# Patient Record
Sex: Female | Born: 2000 | Race: White | Hispanic: No | Marital: Single | Smoking: Never smoker
Health system: Southern US, Community
[De-identification: ages and names within clinical notes are randomized; demographics above are authoritative.]

---
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
## 2021-07-11 ENCOUNTER — Ambulatory Visit (INDEPENDENT_AMBULATORY_CARE_PROVIDER_SITE_OTHER): Payer: Medicaid Other | Admitting: Family Medicine

## 2021-07-11 ENCOUNTER — Other Ambulatory Visit: Payer: Self-pay

## 2021-07-11 ENCOUNTER — Encounter: Payer: Self-pay | Admitting: Family Medicine

## 2021-07-11 VITALS — BP 137/81 | HR 67 | Temp 98.2°F | Ht 70.0 in | Wt 178.0 lb

## 2021-07-11 DIAGNOSIS — Z30011 Encounter for initial prescription of contraceptive pills: Secondary | ICD-10-CM | POA: Diagnosis not present

## 2021-07-11 DIAGNOSIS — Z23 Encounter for immunization: Secondary | ICD-10-CM

## 2021-07-11 MED ORDER — NORETHIN ACE-ETH ESTRAD-FE 1-20 MG-MCG(24) PO TABS: 1.0000 | ORAL_TABLET | Freq: Every day | ORAL | 4 refills | Status: DC

## 2021-07-11 MED ORDER — NORETHINDRONE ACET-ETHINYL EST 1-20 MG-MCG PO TABS: 1.0000 | ORAL_TABLET | Freq: Every day | ORAL | 4 refills | Status: DC

## 2021-07-11 NOTE — Progress Notes (Signed)
New Patient Office Visit  Subjective:  Patient ID: Andrea Acosta, female    DOB: 16-May-2001  Age: 20 y.o. MRN: [id number]  CC:  Chief Complaint  Patient presents with   Establish Care   Discuss [facility]    Patient would like to discuss starting on BCP   Annual Exam    HPI Andrea Acosta presents to establish care.  She would like to start birth control she has never been on it before but she is sexually active she is currently using condoms.  She does have regular periods but they do tend to be heavy.  She was previously followed by pediatrician overall for [place].  She reports that her vaccinations are up-to-date before she started college last year.  She currently goes to [employer]  Also looked over her vaccination record.  It looks like she never received a second meningeal B  History reviewed. No pertinent past medical history.  History reviewed. No pertinent surgical history.  Family History  Problem Relation Age of Onset   Hypertension Father     Social History   Socioeconomic History   Marital status: Single    Spouse name: Not on file   Number of children: Not on file   Years of education: Not on file   Highest education level: Not on file  Occupational History   Occupation: [employer]    Comment: Art History   Occupation: Consulting civil engineer    Comment: [employer]  Tobacco Use   Smoking status: Never   Smokeless tobacco: Never  Substance and Sexual Activity   Alcohol use: Never   Drug use: Never   Sexual activity: Yes    Birth control/protection: Condom  Other Topics Concern   Not on file  Social History Narrative   Consulting civil engineer at [employer]   Social Determinants of Health   Financial Resource Strain: Not on file  Food Insecurity: Not on file  Transportation Needs: Not on file  Physical Activity: Not on file  Stress: Not on file  Social Connections: Not on file  Intimate Partner Violence: Not on file    ROS Review of Systems  Objective:   Today's Vitals: BP  137/81    Pulse 67    Temp 98.2 F (36.8 C)    Ht 5\' 10"  (1.778 m)    Wt 178 lb (80.7 kg)    LMP 07/05/2021    HC 16" (40.6 cm)    SpO2 98%    BMI 25.54 kg/m   Physical Exam Vitals and nursing note reviewed.  Constitutional:      Appearance: She is well-developed.  HENT:     Head: Normocephalic and atraumatic.  Cardiovascular:     Rate and Rhythm: Normal rate and regular rhythm.     Heart sounds: Normal heart sounds.  Pulmonary:     Effort: Pulmonary effort is normal.     Breath sounds: Normal breath sounds.  Skin:    General: Skin is warm and dry.  Neurological:     Mental Status: She is alert and oriented to person, place, and time.  Psychiatric:        Behavior: Behavior normal.    Assessment & Plan:   Problem List Items Addressed This Visit   None Visit Diagnoses     Initiation of OCP (BCP)    -  Primary   Need for meningococcal vaccination       Relevant Orders   Meningococcal B, OMV (Bexsero) (Completed)  Discussed  options currently available for birth control she is most interested in starting with a pill.  Urine for GC and chlamydia collected today.  Offered to also do syphilis and HIV testing but she says she actually had that done recently.  We will start with birth control pill.  If she has any problems or concerns please let us know.  Offered Meng B 2nd vaccine.   Outpatient Encounter Medications as of 07/11/2021  Medication Sig   Norethindrone Acetate-Ethinyl Estrad-FE (LOESTRIN 24 FE) 1-20 MG-MCG(24) tablet Take 1 tablet by mouth daily.   [DISCONTINUED] ISOtretinoin (ACCUTANE) 40 MG capsule Take by mouth.   [DISCONTINUED] norethindrone-ethinyl estradiol (LOESTRIN 1/20, 21,) 1-20 MG-MCG tablet Take 1 tablet by mouth daily.   [DISCONTINUED] Olopatadine HCl 0.2 % SOLN Place one drop into both eyes daily.   No facility-administered encounter medications on file as of 07/11/2021.    Follow-up: Return in about 1 year (around 07/11/2022) for Wellness  Exam.   Nani Gasser, MD

## 2021-07-11 NOTE — Addendum Note (Signed)
Addended by: Osborne Oman on: 07/11/2021 04:01 PM   Modules accepted: Orders

## 2021-07-12 LAB — C. TRACHOMATIS/N. GONORRHOEAE RNA
C. trachomatis RNA, TMA: NOT DETECTED
N. gonorrhoeae RNA, TMA: NOT DETECTED

## 2021-07-12 NOTE — Progress Notes (Signed)
Hi Donzella,  You are negative for gonorrhea and chlamydia.

## 2021-08-12 ENCOUNTER — Encounter: Payer: Self-pay | Admitting: Family Medicine

## 2021-08-17 MED ORDER — DESOGESTREL-ETHINYL ESTRADIOL 0.15-0.02/0.01 MG (21/5) PO TABS: 1.0000 | ORAL_TABLET | Freq: Every day | ORAL | 4 refills | Status: DC

## 2021-08-17 NOTE — Telephone Encounter (Signed)
Changed birth controls to a slightly different progesterone formulation.  Meds ordered this encounter  Medications   desogestrel-ethinyl estradiol (KARIVA) 0.15-0.02/0.01 MG (21/5) tablet    Sig: Take 1 tablet by mouth daily.    Dispense:  84 tablet    Refill:  4

## 2021-09-26 ENCOUNTER — Ambulatory Visit (INDEPENDENT_AMBULATORY_CARE_PROVIDER_SITE_OTHER): Payer: Medicaid Other | Admitting: Family Medicine

## 2021-09-26 ENCOUNTER — Encounter: Payer: Self-pay | Admitting: Family Medicine

## 2021-09-26 ENCOUNTER — Other Ambulatory Visit: Payer: Self-pay

## 2021-09-26 VITALS — BP 118/71 | HR 86 | Ht 70.0 in | Wt 178.0 lb

## 2021-09-26 DIAGNOSIS — R300 Dysuria: Secondary | ICD-10-CM | POA: Diagnosis not present

## 2021-09-26 LAB — POCT URINALYSIS DIP (CLINITEK)
Bilirubin, UA: NEGATIVE
Blood, UA: NEGATIVE
Glucose, UA: NEGATIVE mg/dL
Ketones, POC UA: NEGATIVE mg/dL
Leukocytes, UA: NEGATIVE
Nitrite, UA: NEGATIVE
POC PROTEIN,UA: NEGATIVE
Spec Grav, UA: 1.015 (ref 1.010–1.025)
Urobilinogen, UA: 0.2 E.U./dL
pH, UA: 6 (ref 5.0–8.0)

## 2021-09-26 MED ORDER — DESOGESTREL-ETHINYL ESTRADIOL 0.15-30 MG-MCG PO TABS: 1.0000 | ORAL_TABLET | Freq: Every day | ORAL | 11 refills | Status: DC

## 2021-09-26 NOTE — Patient Instructions (Signed)
Okay to finish out current pill pack and then switch to the new one.  Give it a try for at least 2 to 3 months and let me know if you are still having problems. ?

## 2021-09-26 NOTE — Progress Notes (Signed)
Pt reports that she was seen at UC last week for this and was given ABX (Bactrim DS 800-160) x3 days. She finished on Saturday.  ?

## 2021-09-26 NOTE — Progress Notes (Signed)
? ?Acute Office Visit ? ?Subjective:  ? ? Patient ID: Andrea Acosta, female    DOB: 2001-05-24, 21 y.o.   MRN: [id number] ? ?Chief Complaint  ?Patient presents with  ? Urinary Tract Infection  ? ? ?HPI ?Patient is in today for urinary sxs. Went to UC last Tuesday and was tx with Bactrim  and feels some better but still having some pain with urination.   ? ?Started new birth control in January and says that she is still having pretty heavy periods to the point of actually passing some blood clots.  She also noticed a little bit of spotting when she started her antibiotic. ? ?She is also been noting that she has been having some pain with intercourse.  Same partner. Sometimes a sharp or aching pain. Can feel achy after intercourse.  Had STI testing in December. ? ?History reviewed. No pertinent past medical history. ? ?History reviewed. No pertinent surgical history. ? ?Family History  ?Problem Relation Age of Onset  ? Hypertension Father   ? ? ?Social History  ? ?Socioeconomic History  ? Marital status: Single  ?  Spouse name: Not on file  ? Number of children: Not on file  ? Years of education: Not on file  ? Highest education level: Not on file  ?Occupational History  ? Occupation: [employer]  ?  Comment: Art History  ? Occupation: Ship broker  ?  Comment: [place]  ?Tobacco Use  ? Smoking status: Never  ? Smokeless tobacco: Never  ?Substance and Sexual Activity  ? Alcohol use: Never  ? Drug use: Never  ? Sexual activity: Yes  ?  Birth control/protection: Condom  ?Other Topics Concern  ? Not on file  ?Social History Narrative  ? Student at [employer]  ? ?Social Determinants of Health  ? ?Financial Resource Strain: Not on file  ?Food Insecurity: Not on file  ?Transportation Needs: Not on file  ?Physical Activity: Not on file  ?Stress: Not on file  ?Social Connections: Not on file  ?Intimate Partner Violence: Not on file  ? ? ?Outpatient Medications Prior to Visit  ?Medication Sig Dispense Refill  ? desogestrel-ethinyl  estradiol (KARIVA) 0.15-0.02/0.01 MG (21/5) tablet Take 1 tablet by mouth daily. 84 tablet 4  ? ?No facility-administered medications prior to visit.  ? ? ?No Known Allergies ? ?Review of Systems ? ?   ?Objective:  ?  ?Physical Exam ?Vitals reviewed.  ?Constitutional:   ?   Appearance: She is well-developed.  ?HENT:  ?   Head: Normocephalic and atraumatic.  ?Eyes:  ?   Conjunctiva/sclera: Conjunctivae normal.  ?Cardiovascular:  ?   Rate and Rhythm: Normal rate.  ?Pulmonary:  ?   Effort: Pulmonary effort is normal.  ?Skin: ?   General: Skin is dry.  ?   Coloration: Skin is not pale.  ?Neurological:  ?   Mental Status: She is alert and oriented to person, place, and time.  ?Psychiatric:     ?   Behavior: Behavior normal.  ? ? ?BP 118/71   Pulse 86   Ht _0  (1.778 m)   Wt 178 lb (80.7 kg)   LMP 09/13/2021 (Exact Date)   SpO2 98%   BMI 25.54 kg/m?  ?Wt Readings from Last 3 Encounters:  ?09/26/21 178 lb (80.7 kg) (94 %, Z= 1.55)*  ?07/11/21 178 lb (80.7 kg) (94 %, Z= 1.56)*  ? ?* Growth percentiles are based on CDC (Girls, 2-20 Years) data.  ? ? ?Health Maintenance Due  ?  Topic Date Due  ? HIV Screening  Never done  ? Hepatitis C Screening  Never done  ? ? ?There are no preventive care reminders to display for this patient. ? ? ?No results found for: TSH ?No results found for: WBC, HGB, HCT, MCV, PLT ?No results found for: NA, K, CHLORIDE, CO2, GLUCOSE, BUN, CREATININE, BILITOT, ALKPHOS, AST, ALT, PROT, ALBUMIN, CALCIUM, ANIONGAP, EGFR, GFR ?No results found for: CHOL ?No results found for: HDL ?No results found for: [place] ?No results found for: TRIG ?No results found for: CHOLHDL ?No results found for: HGBA1C ? ?   ?Assessment & Plan:  ? ?Problem List Items Addressed This Visit   ?None ?Visit Diagnoses   ? ? Dysuria    -  Primary  ? Relevant Orders  ? POCT URINALYSIS DIP (CLINITEK) (Completed)  ? Urine Culture  ? ?  ? ?Dysuria-we will send urinalysis for culture we will call her once the culture results are  back in case we need to switch antibiotics. ? ?Contraceptive counseling-we will switch birth control she has done well in the [place] in regards to not making her feel anxious like the [place] did.  So was switched to Apri. ? ?Pain with intercourse-we discussed a couple of options including increasing lubrication, working on very gradual and gentle entry into the vaginal canal to avoid tightening or spasming.  Because she also has heavy periods also consider other causes such as endometriosis etc.  Discussed  consider pelvic evaluation and pelvic ultrasound in the future she will let me know. ? ?Meds ordered this encounter  ?Medications  ? desogestrel-ethinyl estradiol (APRI) 0.15-30 MG-MCG tablet  ?  Sig: Take 1 tablet by mouth daily.  ?  Dispense:  28 tablet  ?  Refill:  11  ? ? ? ?Beatrice Lecher, MD ? ?

## 2021-09-28 ENCOUNTER — Other Ambulatory Visit: Payer: Self-pay

## 2021-09-28 ENCOUNTER — Other Ambulatory Visit: Payer: Self-pay | Admitting: *Deleted

## 2021-09-28 DIAGNOSIS — R102 Pelvic and perineal pain unspecified side: Secondary | ICD-10-CM

## 2021-09-28 LAB — URINE CULTURE
MICRO NUMBER:: 13096258
Result:: NO GROWTH
SPECIMEN QUALITY:: ADEQUATE

## 2021-09-28 NOTE — Progress Notes (Signed)
Hi Andrea Acosta, negative for urinary tract infection.  I do think the antibiotic cleared everything up.  Hopefully you are feeling better.  As far as her pelvic pain concerns just let me know if you would like me to schedule you for an exam and ultrasound or may be consultation with GYN at any point just let me know.

## 2021-09-30 ENCOUNTER — Ambulatory Visit (INDEPENDENT_AMBULATORY_CARE_PROVIDER_SITE_OTHER): Payer: Medicaid Other

## 2021-09-30 ENCOUNTER — Other Ambulatory Visit: Payer: Self-pay

## 2021-09-30 DIAGNOSIS — R102 Pelvic and perineal pain: Secondary | ICD-10-CM | POA: Diagnosis not present

## 2021-09-30 DIAGNOSIS — N941 Unspecified dyspareunia: Secondary | ICD-10-CM

## 2021-09-30 DIAGNOSIS — N920 Excessive and frequent menstruation with regular cycle: Secondary | ICD-10-CM

## 2021-10-03 NOTE — Progress Notes (Signed)
Hi Andrea Acosta, the thickness of the lining is around 10 mm which is normal.  No other worrisome findings at this point in time.  Current recommendation is to try to find a birth control this can help control your cycle with decreased heaviness and bleeding as well as duration of bleeding.  Please switch in birth controls will be helpful.  But if you continue to have discomfort with intercourse I am more than happy to refer you to a GYN for further work-up.

## 2021-11-28 ENCOUNTER — Encounter: Payer: Self-pay | Admitting: Obstetrics & Gynecology

## 2021-11-28 ENCOUNTER — Ambulatory Visit: Payer: Medicaid Other | Admitting: Obstetrics & Gynecology

## 2021-11-28 ENCOUNTER — Other Ambulatory Visit (HOSPITAL_COMMUNITY)
Admission: RE | Admit: 2021-11-28 | Discharge: 2021-11-28 | Disposition: A | Discharge status: Home / Self Care | Payer: Medicaid Other | Source: Ambulatory Visit | Attending: Obstetrics & Gynecology | Admitting: Obstetrics & Gynecology

## 2021-11-28 VITALS — BP 126/85 | HR 80 | Resp 16 | Ht 71.0 in | Wt 185.0 lb

## 2021-11-28 DIAGNOSIS — N94819 Vulvodynia, unspecified: Secondary | ICD-10-CM | POA: Diagnosis not present

## 2021-11-28 DIAGNOSIS — R102 Pelvic and perineal pain unspecified side: Secondary | ICD-10-CM

## 2021-11-28 DIAGNOSIS — Z113 Encounter for screening for infections with a predominantly sexual mode of transmission: Secondary | ICD-10-CM | POA: Insufficient documentation

## 2021-11-28 DIAGNOSIS — N9489 Other specified conditions associated with female genital organs and menstrual cycle: Secondary | ICD-10-CM

## 2021-11-28 MED ORDER — LIDOCAINE 5 % EX OINT: 1.0000 "application " | TOPICAL_OINTMENT | CUTANEOUS | 0 refills | Status: DC | PRN

## 2021-11-28 NOTE — Progress Notes (Signed)
Vaginal pain with and without pain.  No pain with urination. ?

## 2021-11-28 NOTE — Progress Notes (Signed)
? ?  Subjective:  ? ? Patient ID: Andrea Acosta, female    DOB: 2001-06-01, 21 y.o.   MRN: [id number] ? ?HPI ? ?21 year old female presents for vulvar burning and pain with intercourse.  Intercourse has always been painful on insertion.  Used to not be as painful as it is today.  Now lately has vulvar burning when she wears tight jeans or other pressure on the vulva.  Patient denies any discharge.  Patient has only been sexually active 1 year.  She denies any history of sexually transmitted diseases. ? ?Review of Systems  ?Constitutional: Negative.   ?Respiratory: Negative.    ?Cardiovascular: Negative.   ?Gastrointestinal: Negative.   ?Genitourinary:  Positive for dyspareunia and vaginal pain.  ? ?   ?Objective:  ? Physical Exam ?Vitals reviewed.  ?Constitutional:   ?   General: She is not in acute distress. ?   Appearance: She is well-developed.  ?HENT:  ?   Head: Normocephalic and atraumatic.  ?Eyes:  ?   Conjunctiva/sclera: Conjunctivae normal.  ?Cardiovascular:  ?   Rate and Rhythm: Normal rate.  ?Pulmonary:  ?   Effort: Pulmonary effort is normal.  ?Abdominal:  ?   Palpations: Abdomen is soft.  ?   Tenderness: There is no abdominal tenderness.  ?Genitourinary: ? ? ?Skin: ?   General: Skin is warm and dry.  ?Neurological:  ?   Mental Status: She is alert and oriented to person, place, and time.  ?Psychiatric:     ?   Mood and Affect: Mood normal.  ? ? ?Vitals:  ? 11/28/21 0905  ?BP: 126/85  ?Pulse: 80  ?Resp: 16  ?Weight: 185 lb (83.9 kg)  ?Height: 5\' 11"  (1.803 m)  ? ? ?   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 ? Aptima; will complete TOC ?If no infection will refer to pelvic PT; if +infection will clear infection and see if symptoms resolve.  ?Vulvar hygiene reveiwed. ?Silicone based lubricant for intercourse. ?Lidocaine ointment prn  ?If not better will add on oral medications. ? ?30 minutes was spent during this encounter which included review of records, physical  exam, history taking, counseling, and documentation. ?

## 2021-11-29 LAB — CERVICOVAGINAL ANCILLARY ONLY
Bacterial Vaginitis (gardnerella): NEGATIVE
Candida Glabrata: NEGATIVE
Candida Vaginitis: NEGATIVE
Chlamydia: NEGATIVE
Comment: NEGATIVE
Comment: NEGATIVE
Comment: NEGATIVE
Comment: NEGATIVE
Comment: NEGATIVE
Comment: NORMAL
Neisseria Gonorrhea: NEGATIVE
Trichomonas: NEGATIVE

## 2022-01-09 ENCOUNTER — Ambulatory Visit (INDEPENDENT_AMBULATORY_CARE_PROVIDER_SITE_OTHER): Payer: Medicaid Other | Admitting: Obstetrics & Gynecology

## 2022-01-09 ENCOUNTER — Encounter: Payer: Self-pay | Admitting: Obstetrics & Gynecology

## 2022-01-09 VITALS — BP 123/77 | HR 79 | Ht 71.0 in | Wt 184.0 lb

## 2022-01-09 DIAGNOSIS — N94819 Vulvodynia, unspecified: Secondary | ICD-10-CM | POA: Diagnosis not present

## 2022-01-09 DIAGNOSIS — R102 Pelvic and perineal pain: Secondary | ICD-10-CM | POA: Diagnosis not present

## 2022-01-09 DIAGNOSIS — N941 Unspecified dyspareunia: Secondary | ICD-10-CM

## 2022-01-09 NOTE — Progress Notes (Signed)
   Subjective:    Patient ID: Andrea Acosta, female    DOB: June 23, 2001, 21 y.o.   MRN: [id number]  HPI  Andrea Acosta is a 21 year old G0, P64 female who presents for follow-up of vulvodynia and painful intercourse.  The lidocaine has helped with the pain during intercourse.  She was hesitant to go to the physical therapist and wanted talk more about that today.  No new complaints or issues.  Review of Systems  Constitutional: Negative.   Respiratory: Negative.    Cardiovascular: Negative.   Gastrointestinal: Negative.   Genitourinary:  Positive for dyspareunia.      Objective:   Physical Exam Vitals reviewed.  Constitutional:      General: She is not in acute distress.    Appearance: She is well-developed.  HENT:     Head: Normocephalic and atraumatic.  Eyes:     Conjunctiva/sclera: Conjunctivae normal.  Cardiovascular:     Rate and Rhythm: Normal rate.  Pulmonary:     Effort: Pulmonary effort is normal.  Skin:    General: Skin is warm and dry.  Neurological:     Mental Status: She is alert and oriented to person, place, and time.  Psychiatric:        Mood and Affect: Mood normal.    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  She does not like taking medications and had problems taking birth control pills.  After explaining physical therapy further she agrees to go.  There is an office in [place] that she may try.  I am familiar with the practitioners in [place] with [facility] which is always a great option. Return to clinic in 3 months for checkup.  20 minutes was spent with patient, review of records, counseling, and documentation.

## 2022-07-19 ENCOUNTER — Other Ambulatory Visit: Payer: Self-pay | Admitting: Family Medicine

## 2022-07-25 ENCOUNTER — Other Ambulatory Visit: Payer: Self-pay | Admitting: Family Medicine

## 2022-07-27 ENCOUNTER — Other Ambulatory Visit: Payer: Self-pay | Admitting: Family Medicine

## 2022-10-02 ENCOUNTER — Other Ambulatory Visit: Payer: Self-pay | Admitting: Family Medicine

## 2022-10-04 ENCOUNTER — Telehealth: Payer: Self-pay | Admitting: *Deleted

## 2022-10-04 MED ORDER — DESOGESTREL-ETHINYL ESTRADIOL 0.15-30 MG-MCG PO TABS: 1.0000 | ORAL_TABLET | Freq: Every day | ORAL | 0 refills | Status: DC

## 2022-10-04 NOTE — Telephone Encounter (Signed)
Please call pt she is due for OV to get refills for her birth control.

## 2022-10-24 ENCOUNTER — Other Ambulatory Visit: Payer: Self-pay | Admitting: Family Medicine

## 2022-10-30 ENCOUNTER — Telehealth: Payer: Self-pay | Admitting: Family Medicine

## 2022-10-30 MED ORDER — DESOGESTREL-ETHINYL ESTRADIOL 0.15-30 MG-MCG PO TABS: 1.0000 | ORAL_TABLET | Freq: Every day | ORAL | 0 refills | Status: DC

## 2022-10-30 NOTE — Telephone Encounter (Signed)
Patient is requesting refills on Desogestrel-Ethinyl Estradiol 0.15mg -MCG be sent to [facility] on [address] she is scheduled for May 14th 2024

## 2022-10-30 NOTE — Telephone Encounter (Signed)
Medication sent.

## 2022-10-30 NOTE — Telephone Encounter (Deleted)
Patient is requesting refills on desogestrel-estradiol 0.15-30 MG-MCG tablets be sent to

## 2022-10-30 NOTE — Telephone Encounter (Deleted)
Patient is requesting desogestrel-e

## 2022-11-01 ENCOUNTER — Ambulatory Visit: Payer: Medicaid Other | Admitting: Family Medicine

## 2022-12-05 ENCOUNTER — Ambulatory Visit: Payer: Medicaid Other | Admitting: Family Medicine

## 2022-12-05 ENCOUNTER — Encounter: Payer: Self-pay | Admitting: Family Medicine

## 2022-12-05 VITALS — BP 130/64 | HR 84 | Ht 70.0 in | Wt 188.0 lb

## 2022-12-05 DIAGNOSIS — N94819 Vulvodynia, unspecified: Secondary | ICD-10-CM | POA: Diagnosis not present

## 2022-12-05 DIAGNOSIS — Z113 Encounter for screening for infections with a predominantly sexual mode of transmission: Secondary | ICD-10-CM

## 2022-12-05 DIAGNOSIS — Z23 Encounter for immunization: Secondary | ICD-10-CM

## 2022-12-05 DIAGNOSIS — S0300XA Dislocation of jaw, unspecified side, initial encounter: Secondary | ICD-10-CM | POA: Diagnosis not present

## 2022-12-05 DIAGNOSIS — Z3009 Encounter for other general counseling and advice on contraception: Secondary | ICD-10-CM

## 2022-12-05 MED ORDER — DESOGESTREL-ETHINYL ESTRADIOL 0.15-30 MG-MCG PO TABS: 1.0000 | ORAL_TABLET | Freq: Every day | ORAL | 3 refills | Status: DC

## 2022-12-05 NOTE — Progress Notes (Signed)
   Established Patient Office Visit  Subjective   Patient ID: Sophieann Vanornum, female    DOB: 2001/05/13  Age: 22 y.o. MRN: [id number]  Chief Complaint  Patient presents with   Follow-up    HPI  Here today to follow-up on her birth control.  She is very happy with her current regimen and would like refills.  Due for urine GC and chlamydia yearly.  We did discuss that now that she is 22 and sexually active she does need a Pap smear she is welcome to schedule with me or with Dr. Penne Lash this summer.  Discussed need for Tdap as well she is okay to get that today.  She did want to let me know that she was started on sertraline in the fall 50 mg and feels like it has been helpful and working well.  She also has TMJ and did see an urgent care provider.  She was given a muscle relaxer but then would like she can take with the sertraline.  So she is just been using heat and ice.     ROS    Objective:     BP 130/64   Pulse 84   Ht 5\' 10"  (1.778 m)   Wt 188 lb (85.3 kg)   SpO2 99%   BMI 26.98 kg/m    Physical Exam Vitals and nursing note reviewed.  Constitutional:      Appearance: She is well-developed.  HENT:     Head: Normocephalic and atraumatic.  Cardiovascular:     Rate and Rhythm: Normal rate and regular rhythm.     Heart sounds: Normal heart sounds.  Pulmonary:     Effort: Pulmonary effort is normal.     Breath sounds: Normal breath sounds.  Skin:    General: Skin is warm and dry.  Neurological:     Mental Status: She is alert and oriented to person, place, and time.  Psychiatric:        Behavior: Behavior normal.      No results found for any visits on 12/05/22.    The ASCVD Risk score (Arnett DK, et al., 2019) failed to calculate for the following reasons:   The 2019 ASCVD risk score is only valid for ages 53 to 54    Assessment & Plan:   Problem List Items Addressed This Visit       Musculoskeletal and Integument   TMJ (dislocation of  temporomandibular joint)    Okay to continue with heat and ice.  Recommend relaxation techniques.        Genitourinary   Vulvodynia     Other   Encounter for general counseling and advice on contraceptive management - Primary    Med refilled.       Relevant Medications   desogestrel-ethinyl estradiol (APRI) 0.15-30 MG-MCG tablet   Other Relevant Orders   HIV antibody (with reflex)   Hepatitis C Antibody   RPR   Other Visit Diagnoses     Routine screening for STI (sexually transmitted infection)       Relevant Orders   C. trachomatis/N. gonorrhoeae RNA   HIV antibody (with reflex)   Hepatitis C Antibody   RPR      Tdap given today.  No follow-ups on file.    Nani Gasser, MD

## 2022-12-05 NOTE — Assessment & Plan Note (Signed)
Okay to continue with heat and ice.  Recommend relaxation techniques.

## 2022-12-05 NOTE — Assessment & Plan Note (Signed)
Med refilled.

## 2022-12-06 LAB — C. TRACHOMATIS/N. GONORRHOEAE RNA
C. trachomatis RNA, TMA: NOT DETECTED
N. gonorrhoeae RNA, TMA: NOT DETECTED

## 2022-12-06 LAB — RPR: RPR Ser Ql: NONREACTIVE

## 2022-12-06 LAB — HIV ANTIBODY (ROUTINE TESTING W REFLEX): HIV 1&2 Ab, 4th Generation: NONREACTIVE

## 2022-12-06 LAB — HEPATITIS C ANTIBODY: Hepatitis C Ab: NONREACTIVE

## 2022-12-07 NOTE — Progress Notes (Signed)
Hi Andrea Acosta, STI testing is negative. Great news!

## 2022-12-20 IMAGING — US US PELVIS COMPLETE
1 series · 14 of 25 positions shown · non-contrast
Comparison: None.

CLINICAL DATA: Pelvic pain, heavy bleeding, and dyspareunia.

EXAM:
TRANSABDOMINAL ULTRASOUND OF PELVIS
TECHNIQUE: Transabdominal ultrasound examination of the pelvis was performed
including evaluation of the uterus, ovaries, adnexal regions, and
pelvic cul-de-sac.

[Series 1: us pelvis (transabdominal only) · 14 of 39 slices shown]
[im 1/39]
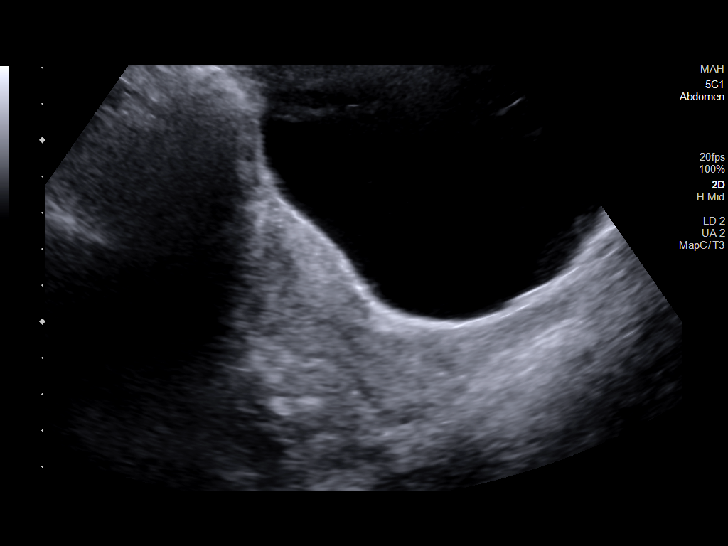
[im 4/39]
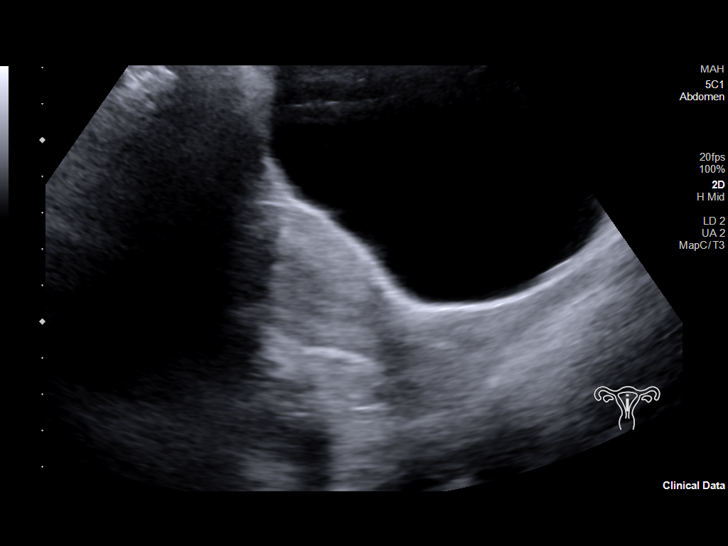
[im 7/39]
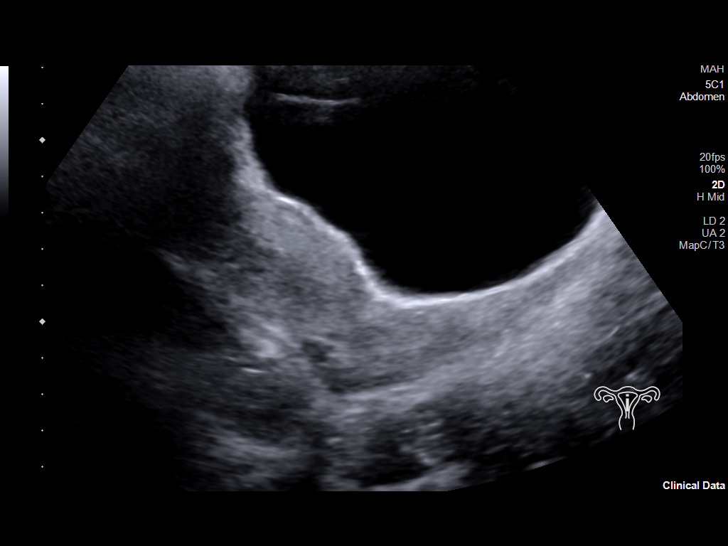
[im 10/39]
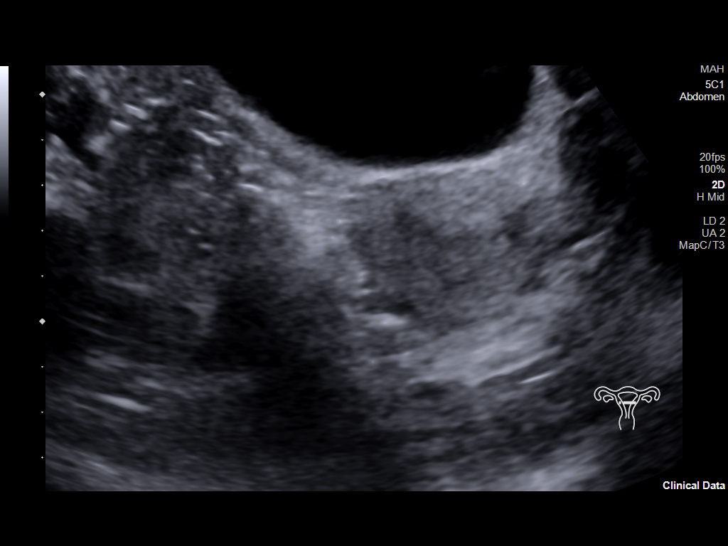
[im 13/39]
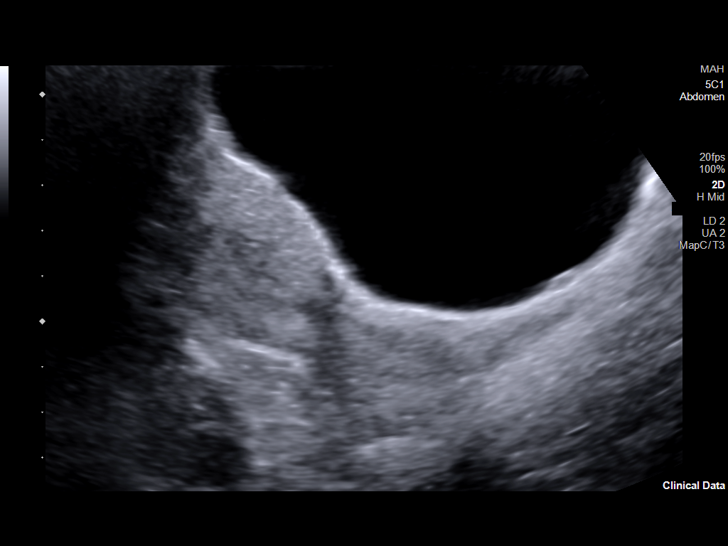
[im 15/39]
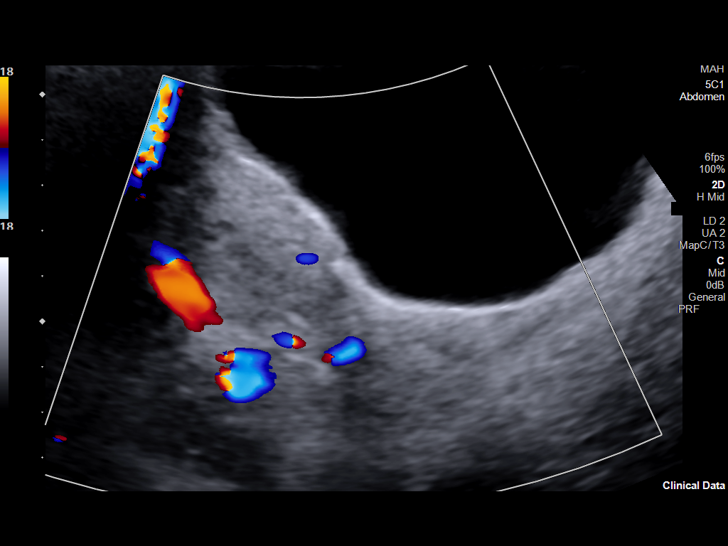
[im 18/39]
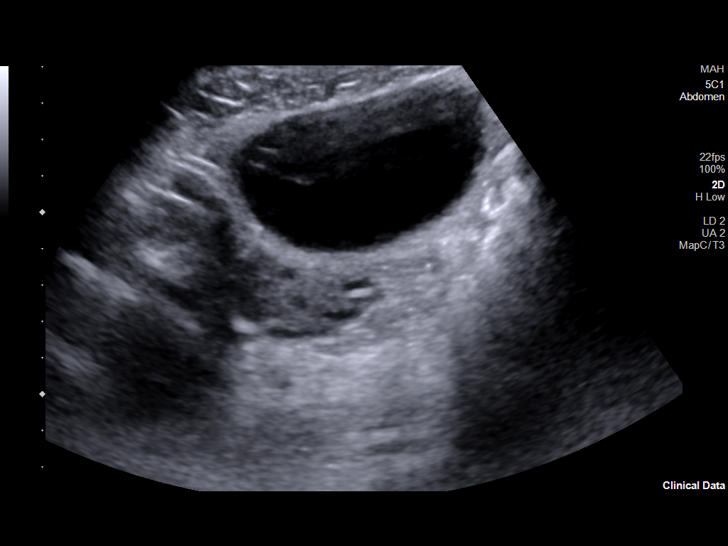
[im 21/39]
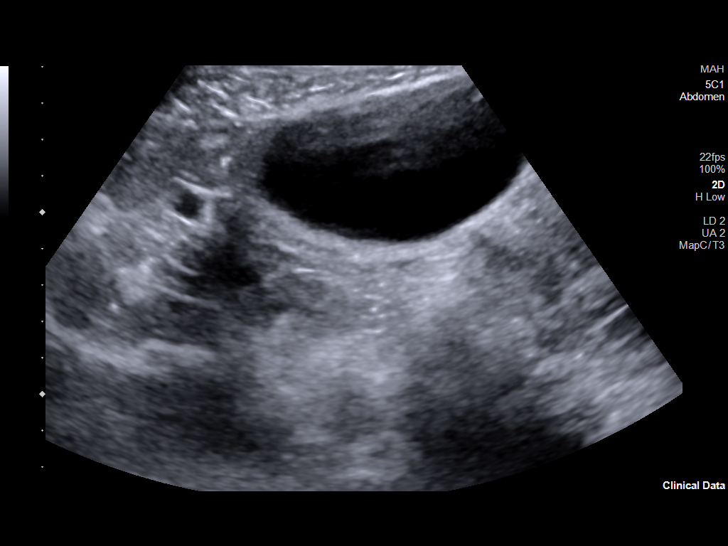
[im 24/39]
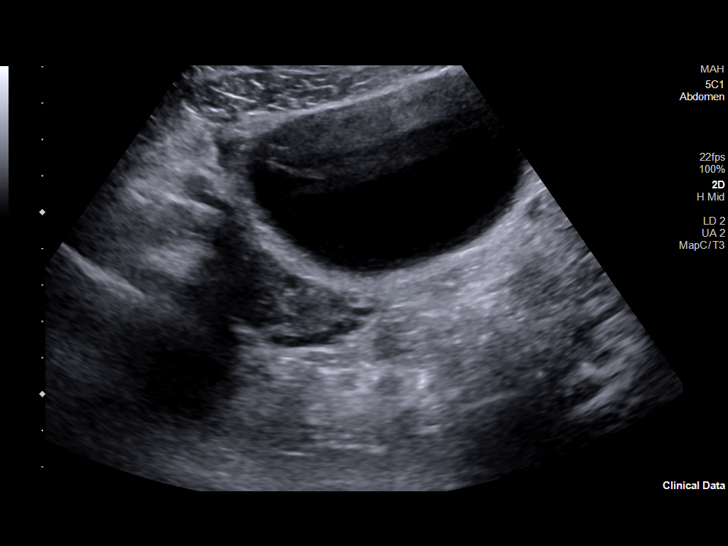
[im 26/39]
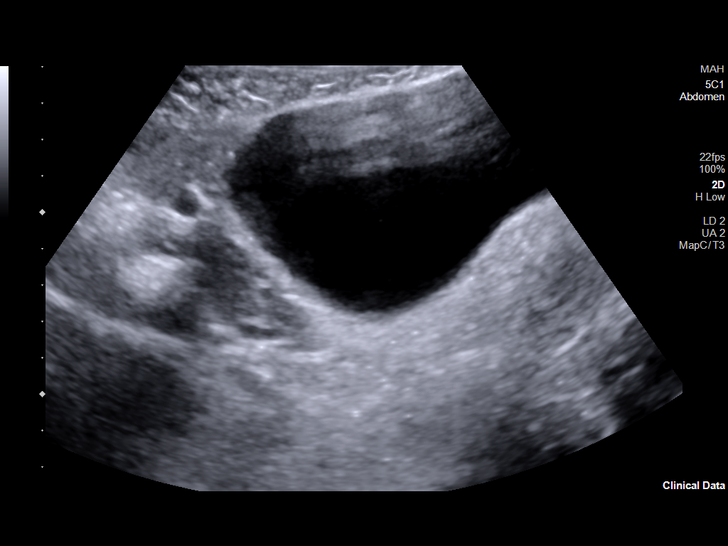
[im 29/39]
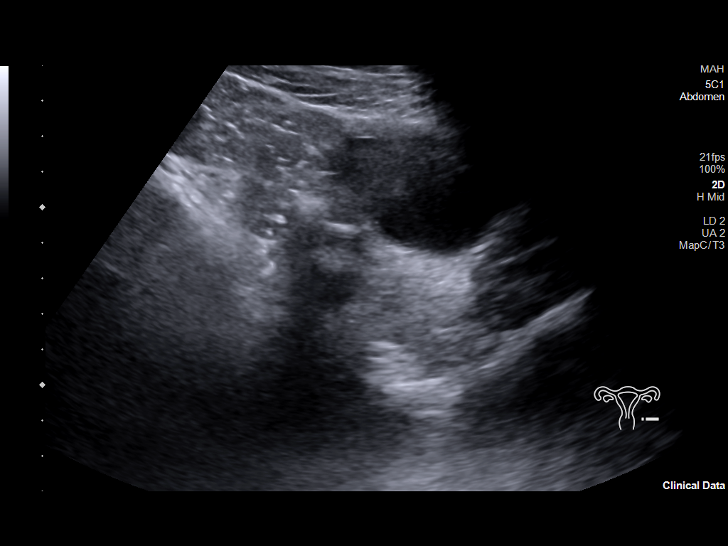
[im 32/39]
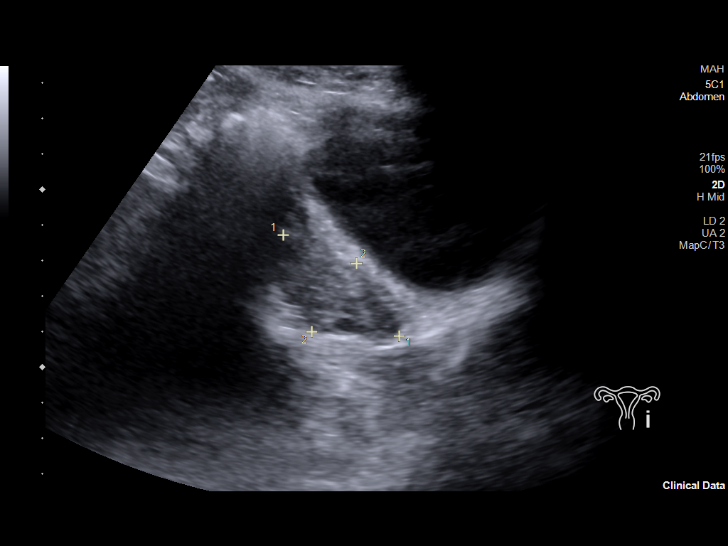
[im 35/39]
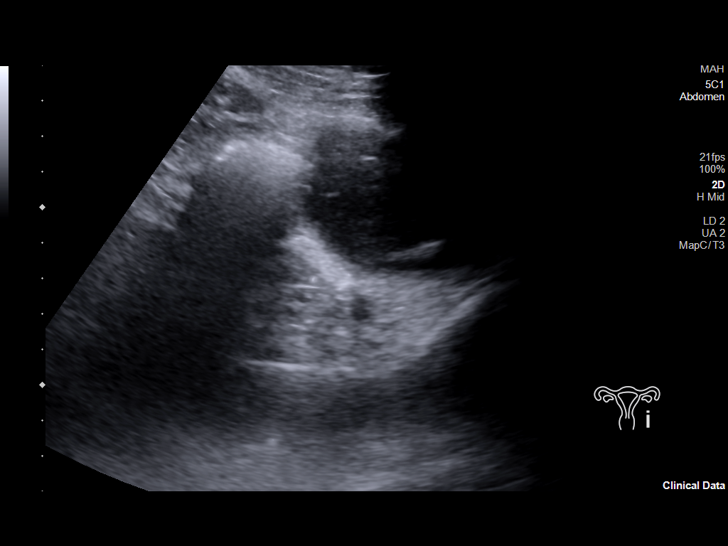
[im 39/39]
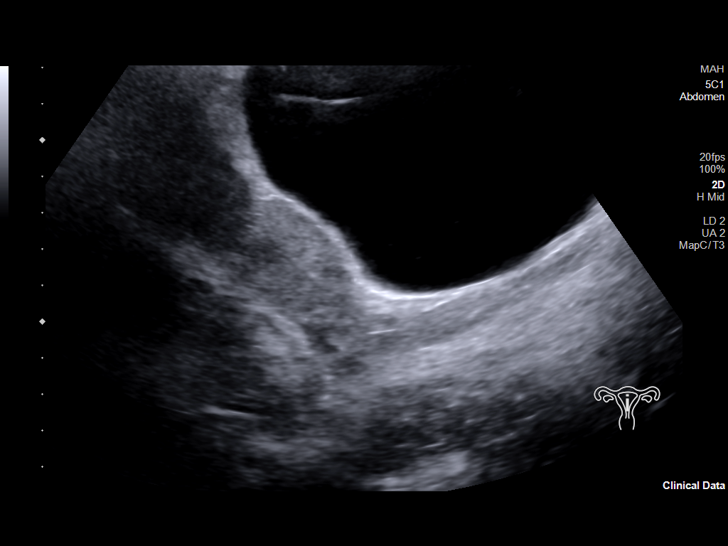

[14 of 25 positions shown; findings below may reference images not displayed]

FINDINGS: Uterus

Measurements: 8.5 x 2.9 x 3.9 cm = volume: 50.1 mL. No fibroids or
other mass visualized.

Endometrium

Thickness: 10.1 mm.  No focal abnormality visualized.

Right ovary

Measurements: 4.3 x 2.3 x 2.6 cm = volume: 13.7 mL. Normal
appearance/no adnexal mass.

Left ovary

Measurements: 4.3 x 2.3 x 2.3 cm = volume: 11.9 mL. Normal
appearance/no adnexal mass.

Other findings:  No abnormal free fluid.
IMPRESSION: 1. The endometrium measures 10.1 mm which is normal for age. If
bleeding remains unresponsive to hormonal or medical therapy,
sonohysterogram should be considered for focal lesion work-up. (Ref:
Radiological Reasoning: Algorithmic Workup of Abnormal Vaginal
Bleeding with Endovaginal Sonography and Sonohysterography. AJR
[id number]; 191:S68-73)
2. No abnormalities identified.

## 2023-03-31 ENCOUNTER — Encounter: Payer: Self-pay | Admitting: Family Medicine

## 2023-05-21 ENCOUNTER — Other Ambulatory Visit: Payer: Self-pay

## 2023-05-21 ENCOUNTER — Encounter: Payer: Self-pay | Admitting: *Deleted

## 2023-05-21 ENCOUNTER — Ambulatory Visit
Admission: EM | Admit: 2023-05-21 | Discharge: 2023-05-21 | Disposition: A | Discharge status: Home / Self Care | Payer: Medicaid Other | Attending: Family Medicine | Admitting: Family Medicine

## 2023-05-21 DIAGNOSIS — S6991XA Unspecified injury of right wrist, hand and finger(s), initial encounter: Secondary | ICD-10-CM | POA: Diagnosis not present

## 2023-05-21 MED ORDER — AMOXICILLIN-POT CLAVULANATE 875-125 MG PO TABS: 1.0000 | ORAL_TABLET | Freq: Two times a day (BID) | ORAL | 0 refills | Status: DC

## 2023-05-21 NOTE — ED Triage Notes (Signed)
Pt presents today after she caught artifical nail on car emergency brake. Pt thinks the nail pulled backwards down to cuticle.

## 2023-05-21 NOTE — Discharge Instructions (Addendum)
Advised Patient to go to nearest nail salon to have artificial nail removed from right middle finger nail plate.  Advised patient if unsuccessful please go to nearest emergency department for further evaluation.  Advised patient to take medication as directed with food to completion.  Encouraged to increase daily water intake to 64 ounces per day while taking this medication.  Advised if symptoms worsen and/or unresolved please follow-up with PCP for further evaluation.

## 2023-05-21 NOTE — ED Provider Notes (Signed)
[facility]    CSN: [id number] Arrival date & time: 05/21/23  1334      History   Chief Complaint Chief Complaint  Patient presents with   nail injury    HPI Andrea Acosta is a 22 y.o. female.   HPI 22 year old female presents with artificial nail injury.  Reports her nail got caught in an emergency brake with nail getting pulled down to cuticle.  PMH significant for obesity, acne vulgaris, and TMJ.  History reviewed. No pertinent past medical history.  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 No pertinent surgical history.  OB History     Gravida  0   Para  0   Term  0   Preterm  0   AB  0   Living  0      SAB  0   IAB  0   Ectopic  0   Multiple  0   Live Births  0            Home Medications    Prior to Admission medications   Medication Sig Start Date End Date Taking? Authorizing Provider  desogestrel-ethinyl estradiol (APRI) 0.15-30 MG-MCG tablet Take 1 tablet by mouth daily. 12/05/22  Yes Agapito Games, MD  sertraline (ZOLOFT) 25 MG tablet Take 25 mg by mouth daily. 06/30/22  Yes [provider]  amoxicillin-clavulanate (AUGMENTIN) 875-125 MG tablet Take 1 tablet by mouth every 12 (twelve) hours. 05/21/23  Yes Trevor Iha, FNP  lidocaine (XYLOCAINE) 5 % ointment Apply 1 application. topically as needed. 11/28/21   Lesly Dukes, MD    Family History Family History  Problem Relation Age of Onset   Heart attack Paternal Grandfather    Hypertension Father     Social History Social History   Tobacco Use   Smoking status: Never   Smokeless tobacco: Never  Vaping Use   Vaping status: Never Used  Substance Use Topics   Alcohol use: Never   Drug use: Never     Allergies    Patient has no known allergies.   Review of Systems Review of Systems  Skin:        Right middle finger nail injury     Physical Exam Triage Vital Signs ED Triage Vitals  Encounter Vitals Group     BP 05/21/23 1453 135/85     Systolic BP Percentile --      Diastolic BP Percentile --      Pulse Rate 05/21/23 1453 62     Resp 05/21/23 1453 18     Temp 05/21/23 1453 98.2 F (36.8 C)     Temp src --      SpO2 05/21/23 1453 99 %     Weight --      Height --      Head Circumference --      Peak Flow --      Pain Score 05/21/23 1449 6     Pain Loc --      Pain Education --      Exclude from Growth Chart --    No data found.  Updated Vital Signs BP 135/85   Pulse 62   Temp 98.2 F (36.8 C)   Resp 18   LMP 04/24/2023   SpO2  99%   Physical Exam Vitals and nursing note reviewed.  Constitutional:      General: She is not in acute distress.    Appearance: Normal appearance. She is normal weight. She is not ill-appearing.  HENT:     Head: Normocephalic and atraumatic.     Mouth/Throat:     Mouth: Mucous membranes are moist.     Pharynx: Oropharynx is clear.  Eyes:     Extraocular Movements: Extraocular movements intact.     Conjunctiva/sclera: Conjunctivae normal.     Pupils: Pupils are equal, round, and reactive to light.  Cardiovascular:     Rate and Rhythm: Normal rate and regular rhythm.     Pulses: Normal pulses.     Heart sounds: Normal heart sounds.  Pulmonary:     Effort: Pulmonary effort is normal.     Breath sounds: Normal breath sounds. No wheezing, rhonchi or rales.  Musculoskeletal:        General: Normal range of motion.     Cervical back: Normal range of motion and neck supple.     Comments: Right middle finger nail plate, artificial nail adhered to finger nail plate, plate intact  Skin:    General: Skin is warm and dry.  Neurological:     General: No focal deficit present.     Mental Status: She is alert and oriented to person, place, and  time. Mental status is at baseline.  Psychiatric:        Mood and Affect: Mood normal.        Behavior: Behavior normal.        Thought Content: Thought content normal.      UC Treatments / Results  Labs (all labs ordered are listed, but only abnormal results are displayed) Labs Reviewed - No data to display  EKG   Radiology No results found.  Procedures Procedures (including critical care time)  Medications Ordered in UC Medications - No data to display  Initial Impression / Assessment and Plan / UC Course  I have reviewed the triage vital signs and the nursing notes.  Pertinent labs & imaging results that were available during my care of the patient were reviewed by me and considered in my medical decision making (see chart for details).     MDM: 1.  Injury to fingernail of right hand, initial encounter-Rx Augmentin 875/125 mg tablet: Take 1 tablet twice daily x 7 days. Advised Patient to go to nearest nail salon to have artificial nail removed from right middle finger nail plate.  Advised patient if unsuccessful please go to nearest emergency department for further evaluation.  Advised patient to take medication as directed with food to completion.  Encouraged to increase daily water intake to 64 ounces per day while taking this medication.  Advised if symptoms worsen and/or unresolved please follow-up with PCP for further evaluation.  Patient discharged home, hemodynamically stable. Final Clinical Impressions(s) / UC Diagnoses   Final diagnoses:  Injury to fingernail of right hand, initial encounter     Discharge Instructions      Advised Patient to go to nearest nail salon to have artificial nail removed from right middle finger nail plate.  Advised patient if unsuccessful please go to nearest emergency department for further evaluation.  Advised patient to take medication as directed with food to completion.  Encouraged to increase daily water intake to 64 ounces per  day while taking this medication.  Advised if symptoms worsen and/or unresolved please follow-up with PCP  for further evaluation.     ED Prescriptions     Medication Sig Dispense Auth. Provider   amoxicillin-clavulanate (AUGMENTIN) 875-125 MG tablet Take 1 tablet by mouth every 12 (twelve) hours. 14 tablet Trevor Iha, FNP      PDMP not reviewed this encounter.   Trevor Iha, FNP 05/21/23 1557

## 2023-06-01 NOTE — Progress Notes (Signed)
  Subjective:     Andrea Acosta is a 22 y.o. female here for a routine exam.  Current complaints: bleeding on birth control pills.  Still having some dyspareunia and interested in pelvic PT in [place].    Gynecologic History Patient's last menstrual period was 05/03/2023. Contraception: OCP (estrogen/progesterone) Last Mammogram: n/a Last Pap Smear:  never done Last Colon Screening;  n/a Seat Belts:   yes Sun Screen:   no Dental Check Up:  yes Brush & Floss:  yes  Obstetric History OB History  Gravida Para Term Preterm AB Living  0 0 0 0 0 0  SAB IAB Ectopic Multiple Live Births  0 0 0 0 0     The following portions of the patient's history were reviewed and updated as appropriate: allergies, current medications, past family history, past medical history, past social history, past surgical history, and problem list.  Review of Systems Pertinent items noted in HPI and remainder of comprehensive ROS otherwise negative.    Objective:     Vitals:   06/04/23 1326  BP: 116/83  Pulse: 73  Weight: 205 lb (93 kg)  Height: 5\' 10"  (1.778 m)   Vitals:  WNL General appearance: alert, cooperative and no distress  HEENT: Normocephalic, without obvious abnormality, atraumatic Eyes: negative Throat: lips, mucosa, and tongue normal; teeth and gums normal  Respiratory: Clear to auscultation bilaterally  CV: Regular rate and rhythm  Breasts:  Normal appearance, no masses or tenderness, no nipple retraction or dimpling  GI: Soft, non-tender; bowel sounds normal; no masses,  no organomegaly  GU: External Genitalia:  Tanner V, no lesion Urethra:  No prolapse   Vagina: Pink, normal rugae, + small amount brown blood, +small amount white discharge  Cervix: No CMT, no lesion  Uterus:  Normal size and contour, non tender  Adnexa: Normal, no masses, non tender  Musculoskeletal: No edema, redness or tenderness in the calves or thighs  Skin: No lesions or rash  Lymphatic: Axillary adenopathy:  none     Psychiatric: Normal mood and behavior        Assessment:    Healthy female exam.    Plan:    Pap smear with reflex testing GC/Chlam testing Spotting during pills--change prog to nortindrone acetate  RTC 1 year.

## 2023-06-04 ENCOUNTER — Encounter: Payer: Self-pay | Admitting: Obstetrics & Gynecology

## 2023-06-04 ENCOUNTER — Other Ambulatory Visit (HOSPITAL_COMMUNITY)
Admission: RE | Admit: 2023-06-04 | Discharge: 2023-06-04 | Disposition: A | Discharge status: Home / Self Care | Payer: Medicaid Other | Source: Ambulatory Visit | Attending: Obstetrics & Gynecology | Admitting: Obstetrics & Gynecology

## 2023-06-04 ENCOUNTER — Ambulatory Visit: Payer: Medicaid Other | Admitting: Obstetrics & Gynecology

## 2023-06-04 VITALS — BP 116/83 | HR 73 | Ht 70.0 in | Wt 205.0 lb

## 2023-06-04 DIAGNOSIS — Z01419 Encounter for gynecological examination (general) (routine) without abnormal findings: Secondary | ICD-10-CM

## 2023-06-04 DIAGNOSIS — N898 Other specified noninflammatory disorders of vagina: Secondary | ICD-10-CM

## 2023-06-04 MED ORDER — NORETHIN ACE-ETH ESTRAD-FE 1-20 MG-MCG(24) PO TABS: 1.0000 | ORAL_TABLET | Freq: Every day | ORAL | 11 refills | Status: DC

## 2023-06-05 LAB — CERVICOVAGINAL ANCILLARY ONLY
Bacterial Vaginitis (gardnerella): NEGATIVE
Candida Glabrata: POSITIVE — AB
Candida Vaginitis: NEGATIVE
Comment: NEGATIVE
Comment: NEGATIVE
Comment: NEGATIVE

## 2023-06-06 LAB — CYTOLOGY - PAP
Adequacy: ABSENT
Chlamydia: NEGATIVE
Comment: NEGATIVE
Comment: NORMAL
Diagnosis: NEGATIVE
Neisseria Gonorrhea: NEGATIVE

## 2023-07-09 ENCOUNTER — Encounter: Payer: Self-pay | Admitting: Obstetrics & Gynecology

## 2023-07-17 ENCOUNTER — Ambulatory Visit
Admission: RE | Admit: 2023-07-17 | Discharge: 2023-07-17 | Disposition: A | Discharge status: Home / Self Care | Payer: Medicaid Other | Source: Ambulatory Visit | Attending: Family Medicine | Admitting: Family Medicine

## 2023-07-17 VITALS — BP 119/81 | HR 83 | Temp 98.2°F | Resp 16

## 2023-07-17 DIAGNOSIS — R059 Cough, unspecified: Secondary | ICD-10-CM

## 2023-07-17 DIAGNOSIS — J069 Acute upper respiratory infection, unspecified: Secondary | ICD-10-CM

## 2023-07-17 LAB — POCT INFLUENZA A/B
Influenza A, POC: NEGATIVE
Influenza B, POC: NEGATIVE

## 2023-07-17 MED ORDER — AZITHROMYCIN 250 MG PO TABS: 250.0000 mg | ORAL_TABLET | Freq: Every day | ORAL | 0 refills | Status: AC

## 2023-07-17 MED ORDER — PREDNISONE 20 MG PO TABS: ORAL_TABLET | ORAL | 0 refills | Status: AC

## 2023-07-17 NOTE — ED Triage Notes (Signed)
Pt presents to uc with co of cough congestion ha and body aches since Saturday. Pt reports she has been using motrin and musinex. Father positive for the flu.

## 2023-07-17 NOTE — Discharge Instructions (Addendum)
Advised patient to take medication as directed with food to completion.  Advised patient to take prednisone with Zithromax daily for the next 5 days.  Encouraged to increase daily water intake to 64 ounces per day while taking this medication.  Advised if symptoms worsen and/or unresolved please follow-up with PCP or here for further evaluation.

## 2023-07-17 NOTE — ED Provider Notes (Signed)
[facility]    CSN: [id number] Arrival date & time: 07/17/23  0807      History   Chief Complaint Chief Complaint  Patient presents with   Cough    I have a cough and my dad just went to the doctor and said he had viral cold - Entered by patient   URI    HPI Andrea Acosta is a 22 y.o. female.   HPI 22 year old female presents with cough, congestion, headaches and bodyaches for 3 days.  Patient reports father tested positive for flu.  PMH significant for obesity and acne vulgaris.  History reviewed. No pertinent past medical history.  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 No pertinent surgical history.  OB History     Gravida  0   Para  0   Term  0   Preterm  0   AB  0   Living  0      SAB  0   IAB  0   Ectopic  0   Multiple  0   Live Births  0            Home Medications    Prior to Admission medications   Medication Sig Start Date End Date Taking? Authorizing Provider  azithromycin (ZITHROMAX) 250 MG tablet Take 1 tablet (250 mg total) by mouth daily. Take first 2 tablets together, then 1 every day until finished. 07/17/23  Yes Trevor Iha, FNP  predniSONE (DELTASONE) 20 MG tablet Take 3 tabs PO daily x 5 days. 07/17/23  Yes Trevor Iha, FNP  Norethindrone Acetate-Ethinyl Estrad-FE (LOESTRIN 24 FE) 1-20 MG-MCG(24) tablet Take 1 tablet by mouth daily. 06/04/23   Lesly Dukes, MD  sertraline (ZOLOFT) 25 MG tablet Take 25 mg by mouth daily. 06/30/22   [provider]    Family History Family History  Problem Relation Age of Onset   Hypertension Father    Heart attack Paternal Grandfather    Healthy Neg Hx     Social History Social History   Tobacco Use   Smoking status: Never    Smokeless tobacco: Never  Vaping Use   Vaping status: Never Used  Substance Use Topics   Alcohol use: Never   Drug use: Never     Allergies   Patient has no known allergies.   Review of Systems Review of Systems  HENT:  Positive for congestion.   Respiratory:  Positive for cough.   Musculoskeletal:  Positive for arthralgias and myalgias.  Neurological:  Positive for headaches.  All other systems reviewed and are negative.    Physical Exam Triage Vital Signs ED Triage Vitals  Encounter Vitals Group     BP 07/17/23 0826 119/81     Systolic BP Percentile --      Diastolic BP Percentile --      Pulse Rate 07/17/23 0826 83     Resp 07/17/23 0826 16     Temp 07/17/23 0826 98.2 F (36.8 C)     Temp src --      SpO2 07/17/23 0826 98 %     Weight --      Height --      Head Circumference --      Peak Flow --  Pain Score 07/17/23 0825 0     Pain Loc --      Pain Education --      Exclude from Growth Chart --    No data found.  Updated Vital Signs BP 119/81   Pulse 83   Temp 98.2 F (36.8 C)   Resp 16   LMP 06/26/2023   SpO2 98%     Physical Exam Vitals and nursing note reviewed.  Constitutional:      Appearance: Normal appearance. She is obese. She is ill-appearing.  HENT:     Head: Normocephalic and atraumatic.     Right Ear: Tympanic membrane, ear canal and external ear normal.     Left Ear: Tympanic membrane, ear canal and external ear normal.     Mouth/Throat:     Mouth: Mucous membranes are moist.     Pharynx: Oropharynx is clear.  Eyes:     Extraocular Movements: Extraocular movements intact.     Conjunctiva/sclera: Conjunctivae normal.     Pupils: Pupils are equal, round, and reactive to light.  Cardiovascular:     Rate and Rhythm: Normal rate and regular rhythm.     Pulses: Normal pulses.     Heart sounds: Normal heart sounds.  Pulmonary:     Effort: Pulmonary effort is normal.     Breath sounds: Normal breath sounds. No wheezing,  rhonchi or rales.     Comments: Infrequent nonproductive cough on exam Musculoskeletal:        General: Normal range of motion.     Cervical back: Normal range of motion and neck supple.  Skin:    General: Skin is warm and dry.  Neurological:     General: No focal deficit present.     Mental Status: She is alert and oriented to person, place, and time.  Psychiatric:        Mood and Affect: Mood normal.        Behavior: Behavior normal.      UC Treatments / Results  Labs (all labs ordered are listed, but only abnormal results are displayed) Labs Reviewed  POCT INFLUENZA A/B - Normal    EKG   Radiology No results found.  Procedures Procedures (including critical care time)  Medications Ordered in UC Medications - No data to display  Initial Impression / Assessment and Plan / UC Course  I have reviewed the triage vital signs and the nursing notes.  Pertinent labs & imaging results that were available during my care of the patient were reviewed by me and considered in my medical decision making (see chart for details).     MDM: 1.  Acute upper respiratory infection-Rx'd Zithromax (500 mg day 1, then 250 mg day 2-5), Rx'd prednisone 20 mg tablet: Take 3 tabs p.o. daily x 5 days. Advised patient to take medication as directed with food to completion.  Advised patient to take prednisone with Zithromax daily for the next 5 days.  Encouraged to increase daily water intake to 64 ounces per day while taking this medication.  Advised if symptoms worsen and/or unresolved please follow-up with PCP or here for further evaluation.  Patient discharged home, hemodynamically stable.  Final Clinical Impressions(s) / UC Diagnoses   Final diagnoses:  Cough, unspecified type  Acute upper respiratory infection     Discharge Instructions      Advised patient to take medication as directed with food to completion.  Advised patient to take prednisone with Zithromax daily for the next 5  days.  Encouraged to increase daily water intake to 64 ounces per day while taking this medication.  Advised if symptoms worsen and/or unresolved please follow-up with PCP or here for further evaluation.     ED Prescriptions     Medication Sig Dispense Auth. Provider   azithromycin (ZITHROMAX) 250 MG tablet Take 1 tablet (250 mg total) by mouth daily. Take first 2 tablets together, then 1 every day until finished. 6 tablet Trevor Iha, FNP   predniSONE (DELTASONE) 20 MG tablet Take 3 tabs PO daily x 5 days. 15 tablet Trevor Iha, FNP      PDMP not reviewed this encounter.   Trevor Iha, FNP 07/17/23 [id number]

## 2024-01-29 ENCOUNTER — Telehealth: Payer: Self-pay

## 2024-01-29 NOTE — Telephone Encounter (Signed)
 I called pt and left a voicemail letting her know that she should still have (birth control) refills left at [facility] in [place] and if she needs it switched to another [facility], to let them know and  they should be able to do that for her.

## 2024-04-25 ENCOUNTER — Other Ambulatory Visit: Payer: Self-pay

## 2024-04-25 MED ORDER — NORETHIN ACE-ETH ESTRAD-FE 1-20 MG-MCG(24) PO TABS: 1.0000 | ORAL_TABLET | Freq: Every day | ORAL | 11 refills | Status: AC
